# Patient Record
Sex: Male | Born: 1990 | Race: Black or African American | Hispanic: No | Marital: Single | Smoking: Former smoker
Health system: Southern US, Community
[De-identification: ages and names within clinical notes are randomized; demographics above are authoritative.]

## PROBLEM LIST (undated history)

## (undated) HISTORY — PX: HERNIA REPAIR: SHX51

---
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
## 2008-12-10 ENCOUNTER — Emergency Department (HOSPITAL_COMMUNITY): Admission: EM | Admit: 2008-12-10 | Discharge: 2008-12-10 | Payer: Self-pay | Admitting: Emergency Medicine

## 2011-08-04 LAB — CBC
Hemoglobin: 17.6 — ABNORMAL HIGH
MCHC: 33.3
MCV: 83
Platelets: 269
RDW: 13.3
WBC: 4.4

## 2011-08-04 LAB — I-STAT 8, (EC8 V) (CONVERTED LAB)
BUN: 7
Glucose, Bld: 92
Potassium: 3.7
TCO2: 28
pH, Ven: 7.372 — ABNORMAL HIGH

## 2011-08-04 LAB — COMPREHENSIVE METABOLIC PANEL
ALT: 17
Albumin: 4.4
Alkaline Phosphatase: 128 — ABNORMAL HIGH
Glucose, Bld: 81
Potassium: 3.8
Sodium: 136
Total Protein: 7.8

## 2011-08-04 LAB — URINALYSIS, ROUTINE W REFLEX MICROSCOPIC
Bilirubin Urine: NEGATIVE
Hgb urine dipstick: NEGATIVE
Hgb urine dipstick: NEGATIVE
Ketones, ur: NEGATIVE
Protein, ur: NEGATIVE
Protein, ur: NEGATIVE
Urobilinogen, UA: 0.2
Urobilinogen, UA: 0.2

## 2011-08-04 LAB — DIFFERENTIAL
Basophils Relative: 0
Basophils Relative: 0
Eosinophils Absolute: 0.1
Eosinophils Absolute: 0.6
Monocytes Absolute: 0.2
Monocytes Relative: 5
Monocytes Relative: 7
Neutrophils Relative %: 32 — ABNORMAL LOW
Neutrophils Relative %: 69

## 2011-08-04 LAB — POCT I-STAT CREATININE: Operator id: 270111

## 2011-09-05 ENCOUNTER — Emergency Department (HOSPITAL_COMMUNITY)
Admission: EM | Admit: 2011-09-05 | Discharge: 2011-09-06 | Disposition: A | Discharge status: Home / Self Care | Payer: Self-pay | Attending: Emergency Medicine | Admitting: Emergency Medicine

## 2011-09-05 ENCOUNTER — Emergency Department (HOSPITAL_COMMUNITY): Payer: Self-pay

## 2011-09-05 ENCOUNTER — Encounter: Payer: Self-pay | Admitting: *Deleted

## 2011-09-05 DIAGNOSIS — W219XXA Striking against or struck by unspecified sports equipment, initial encounter: Secondary | ICD-10-CM | POA: Insufficient documentation

## 2011-09-05 DIAGNOSIS — M79609 Pain in unspecified limb: Secondary | ICD-10-CM | POA: Insufficient documentation

## 2011-09-05 DIAGNOSIS — M79644 Pain in right finger(s): Secondary | ICD-10-CM

## 2011-09-05 DIAGNOSIS — Y9361 Activity, american tackle football: Secondary | ICD-10-CM | POA: Insufficient documentation

## 2011-09-05 NOTE — ED Notes (Addendum)
C/o R little finger injury, injured playing football Sunday, "thinks it might be dislocated", (wants L little finger checked out also d/t past injury and not healing right), c/o R little finger pain, no deformity noted, swelling possible. Skin intact.

## 2011-09-06 NOTE — ED Notes (Signed)
The pt jammed his rt little finger x 2 playing football yesterday.  C/o pain and swelling

## 2011-09-06 NOTE — ED Notes (Signed)
Right little finger and ring finger buddy taped

## 2011-09-06 NOTE — ED Provider Notes (Signed)
History     CSN: [id number] Arrival date & time: 09/05/2011 11:13 PM   First MD Initiated Contact with Patient 09/06/11 0303      Chief Complaint  Patient presents with  . Finger Injury    (Consider location/radiation/quality/duration/timing/severity/associated sxs/prior treatment) The history is provided by the patient.   patient reports injury to his right little finger yesterday while playing football.  He reports he jammed his.  He reports there was some deformity the symptoms resolved after he pulled on it.  He continues to have pain and difficulty bending his finger at the distal phalanx and DIP joint of his right little finger.  Nothing worsens the symptoms.  Nothing improves his symptoms.  He's had no other complaints.  His pain is moderate.  He has no numbness or tingling.  History reviewed. No pertinent past medical history.  Past Surgical History  Procedure Date  . Hernia repair     History reviewed. No pertinent family history.  History  Substance Use Topics  . Smoking status: Former Games developer  . Smokeless tobacco: Former Neurosurgeon    Quit date: 08/16/2011  . Alcohol Use: No      Review of Systems  All other systems reviewed and are negative.    Allergies  Shellfish-derived products  Home Medications  No current outpatient prescriptions on file.  BP 109/59  Pulse 69  Temp(Src) 98 F (36.7 C) (Oral)  Resp 15  SpO2 96%  Physical Exam  Constitutional: He is oriented to person, place, and time. He appears well-developed and well-nourished.  HENT:  Head: Normocephalic.  Eyes: EOM are normal.  Neck: Normal range of motion.  Pulmonary/Chest: Effort normal.  Musculoskeletal: Normal range of motion.       R. little finger with mild swelling of the DIP joint.  Patient is unable to flex at the DIP joint on exam.  He is able to extend without difficulty.  Is no pain with passive range of motion of his DIP joint of his right little finger.  Has normal perfusion  of his right little finger.  There are  no other abnormalities of the rest of his hand on the right  Neurological: He is alert and oriented to person, place, and time.  Psychiatric: He has a normal mood and affect.    ED Course  Procedures (including critical care time)  Labs Reviewed - No data to display Dg Finger Little Right  09/05/2011  *RADIOLOGY REPORT*  Clinical Data: Injury to right little finger while playing football; distal fifth interphalangeal joint pain.  RIGHT LITTLE FINGER 2+V  Comparison: None.  Findings: There is no evidence of fracture or dislocation.  There is dorsal subluxation at the fifth distal interphalangeal joint, with mild widening of the joint space.  This is thought to reflect underlying ligamentous injury.  An underlying joint effusion cannot be excluded.  No definite flexor or extensor tendon injury is characterized.  Remaining visualized joint spaces are preserved.  IMPRESSION:  1.  No evidence of fracture or dislocation. 2.  Dorsal subluxation at the fifth distal interphalangeal joint, with mild widening of the joint space.  This is thought to reflect underlying ligamentous injury; an underlying joint effusion cannot be excluded.  Original Report Authenticated By: Tonia Ghent, M.D.   I personally reviewed the patient's x-ray  1. Pain in finger of right hand       MDM  Suspect possible ligamentous injury.  No prior dislocation.  No lacerations.  No signs of infection.  We'll buddy tape the right little finger. Hand surgery follow up. Suspect [place] finger        Lyanne Co, MD 09/06/11 [phone]

## 2012-03-01 ENCOUNTER — Encounter (HOSPITAL_COMMUNITY): Payer: Self-pay

## 2012-03-01 ENCOUNTER — Emergency Department (HOSPITAL_COMMUNITY): Payer: Self-pay

## 2012-03-01 ENCOUNTER — Emergency Department (HOSPITAL_COMMUNITY)
Admission: EM | Admit: 2012-03-01 | Discharge: 2012-03-01 | Disposition: A | Discharge status: Home / Self Care | Payer: Self-pay | Attending: Emergency Medicine | Admitting: Emergency Medicine

## 2012-03-01 DIAGNOSIS — R0781 Pleurodynia: Secondary | ICD-10-CM

## 2012-03-01 DIAGNOSIS — R109 Unspecified abdominal pain: Secondary | ICD-10-CM | POA: Insufficient documentation

## 2012-03-01 DIAGNOSIS — R0789 Other chest pain: Secondary | ICD-10-CM | POA: Insufficient documentation

## 2012-03-01 MED ORDER — IBUPROFEN 600 MG PO TABS: 600.0000 mg | ORAL_TABLET | Freq: Four times a day (QID) | ORAL | Status: AC | PRN

## 2012-03-01 MED ORDER — OXYCODONE-ACETAMINOPHEN 5-325 MG PO TABS
1.0000 | ORAL_TABLET | Freq: Once | ORAL | Status: AC
  Administered 2012-03-01: 1 via ORAL
  Filled 2012-03-01: qty 1

## 2012-03-01 MED ORDER — OXYCODONE-ACETAMINOPHEN 5-325 MG PO TABS: 2.0000 | ORAL_TABLET | ORAL | Status: AC | PRN

## 2012-03-01 NOTE — Discharge Instructions (Signed)
Musculoskeletal Pain Musculoskeletal pain is muscle and boney aches and pains. These pains can occur in any part of the body. Your caregiver may treat you without knowing the cause of the pain. They may treat you if blood or urine tests, X-rays, and other tests were normal.  CAUSES There is often not a definite cause or reason for these pains. These pains may be caused by a type of germ (virus). The discomfort may also come from overuse. Overuse includes working out too hard when your body is not fit. Boney aches also come from weather changes. Bone is sensitive to atmospheric pressure changes. HOME CARE INSTRUCTIONS   Ask when your test results will be ready. Make sure you get your test results.   Only take over-the-counter or prescription medicines for pain, discomfort, or fever as directed by your caregiver. If you were given medications for your condition, do not drive, operate machinery or power tools, or sign legal documents for 24 hours. Do not drink alcohol. Do not take sleeping pills or other medications that may interfere with treatment.   Continue all activities unless the activities cause more pain. When the pain lessens, slowly resume normal activities. Gradually increase the intensity and duration of the activities or exercise.   During periods of severe pain, bed rest may be helpful. Lay or sit in any position that is comfortable.   Putting ice on the injured area.   Put ice in a bag.   Place a towel between your skin and the bag.   Leave the ice on for 15 to 20 minutes, 3 to 4 times a day.   Follow up with your caregiver for continued problems and no reason can be found for the pain. If the pain becomes worse or does not go away, it may be necessary to repeat tests or do additional testing. Your caregiver may need to look further for a possible cause.  SEEK IMMEDIATE MEDICAL CARE IF:  You have pain that is getting worse and is not relieved by medications.   You develop  chest pain that is associated with shortness or breath, sweating, feeling sick to your stomach (nauseous), or throw up (vomit).   Your pain becomes localized to the abdomen.   You develop any new symptoms that seem different or that concern you.  MAKE SURE YOU:   Understand these instructions.   Will watch your condition.   Will get help right away if you are not doing well or get worse.  Document Released: 10/10/2005 Document Revised: 09/29/2011 Document Reviewed: 05/30/2008 [facility] Patient Information 2012 [place], [place].  RESOURCE GUIDE  Dental Problems  Patients with Medicaid: [facility] [phone] [address]                                           [phone] [address] Phone:  [phone]                                                   Phone:  [phone]  If unable to pay or uninsured, contact:  [facility] or [facility]  Dept. to become qualified for the adult dental clinic.  Chronic Pain Problems Contact [facility]  [phone] Patients need to be referred by their primary care doctor.  Insufficient Money for Medicine Contact [employer]:  call "211" or [facility] [phone].  No Primary Care Doctor Call [facility]  [phone] Other agencies that provide inexpensive medical care    [facility]  [phone]    [facility]  [phone]    [facility]  [phone]    [facility]  [phone]    [facility]  [phone]    [facility]  [phone]  [facility]  [phone] [facility]  [phone] [facility]   [phone] (emergency services [phone])  Abuse/Neglect [facility] Child Abuse Hotline 413-593-7815 [facility] Child Abuse Hotline (980)165-5942 (After Hours)  Emergency Shelter [facility] [phone]  [facility] [phone] [facility] [phone]  MRSA Hotline #:   947-819-7161    [facility] Resources  [facility]  [employer]                           [facility] [address] [place]                     [address]         [address]  Blondell Reveal Phone:  [phone]                                  Phone:  [phone]                   Phone:  [phone]  [facility] Phone:  [phone]  [facility] Child Abuse Hotline [phone] [phone] (After Hours)

## 2012-03-01 NOTE — ED Notes (Signed)
Patient states onset left rib pain after playing football few days ago. States pain currently at rest 0/10 when move a certain way, cough or sneeze pain increases to 8/10 achy sharp shooting pain.  Airway intact bilateral equal chest rise and fall.  Lungs clear all fields.

## 2012-03-01 NOTE — ED Provider Notes (Signed)
History   This chart was scribed for Forbes Cellar, MD by Melba Coon. The patient was seen in room STRE6/STRE6 and the patient's care was started at 12:30PM.    CSN: [id number]  Arrival date & time 03/01/12  1150   First MD Initiated Contact with Patient 03/01/12 1201      Chief Complaint  Patient presents with  . Back Pain    (Consider location/radiation/quality/duration/timing/severity/associated sxs/prior treatment) HPI Wayne Thomas is a 21 y.o. male who presents to the Emergency Department complaining of constant moderate to severe back pain with an onset 5 days ago. Pt was playing a flag football game around onset; no trauma to back; States he turned "in a weird way" and heard a "pop", think he broke his left rib. Flexeril did not alleviate the pain. No incontinence or urinary problems. No HA, fever, neck pain, sore throat, rash, SOB/cp/palpitations, abd pain, n/v/d, dysuria, or extremity pain, edema, weakness, numbness, or tingling. No known allergies. No other pertinent medical symptoms. Denies h/o VTE in self or family. No recent hosp/surg/immob. No h/o cancer. Denies exogenous hormone use, no leg pain or swelling. Denies hematuria/dysuria/freq/urgency     ED Notes, ED Provider Notes from 03/01/12 0000 to 03/01/12 11:54:57       Joice Lofts, RN 03/01/2012 11:54      Pt complains of back pain and maybe a broken rib since football game on satruday     History reviewed. No pertinent past medical history.  Past Surgical History  Procedure Date  . Hernia repair     History reviewed. No pertinent family history.  History  Substance Use Topics  . Smoking status: Former Games developer  . Smokeless tobacco: Former Neurosurgeon    Quit date: 08/16/2011  . Alcohol Use: No     Review of Systems 10 Systems reviewed and all are negative for acute change except as noted in the HPI.   Allergies  Shellfish-derived products  Home Medications   Current Outpatient Rx  Name Route Sig  Dispense Refill  . IBUPROFEN 600 MG PO TABS Oral Take 1 tablet (600 mg total) by mouth every 6 (six) hours as needed for pain. 30 tablet 0  . OXYCODONE-ACETAMINOPHEN 5-325 MG PO TABS Oral Take 2 tablets by mouth every 4 (four) hours as needed for pain. 6 tablet 0    BP 124/74  Pulse 75  Temp(Src) 98.5 F (36.9 C) (Oral)  Resp 18  SpO2 100%  Physical Exam  Nursing note and vitals reviewed. Constitutional: He is oriented to person, place, and time. He appears well-developed and well-nourished. No distress.  HENT:  Head: Normocephalic and atraumatic.  Eyes: EOM are normal. Pupils are equal, round, and reactive to light.  Neck: Normal range of motion. Neck supple. No tracheal deviation present.       trachea midline  Cardiovascular: Normal rate.   Pulmonary/Chest: Effort normal. No respiratory distress.  Abdominal: Soft. There is no tenderness.       No CVAT  Musculoskeletal: Normal range of motion. He exhibits tenderness (Left axillary lower rib tenderness). He exhibits no edema.       Spine is midline. No paraspinal lumbar tenderness  Neurological: He is alert and oriented to person, place, and time.  Skin: Skin is warm and dry.  Psychiatric: He has a normal mood and affect. His behavior is normal.    ED Course  Procedures (including critical care time)  DIAGNOSTIC STUDIES: Oxygen Saturation is 100% on room air, normal by my interpretation.  COORDINATION OF CARE:  12:33PM - EDMD will order percocet and a left rib XR for the pt.   Labs Reviewed - No data to display Dg Ribs Unilateral W/chest Left  03/01/2012  *RADIOLOGY REPORT*  Clinical Data: Lower left rib pain.  LEFT RIBS AND CHEST - 3+ VIEW  Comparison: PA and lateral chest 07/16/2007.  Findings: Lungs are clear.  No pneumothorax or pleural fluid. Heart size normal.  No fracture.  IMPRESSION: Negative exam.  Original Report Authenticated By: Bernadene Bell. D'ALESSIO, M.D.    1. Rib pain on left side   2. Flank pain      MDM  Lt flank/rib pain with point tenderness after playing football. No recent illness. No direct trauma. Doubt intra-abdominal etiology but patient given strict precautions for return. No EMC precluding discharge at this time. Given Precautions for return. PMD f/u.  I personally performed the services described in this documentation, which was scribed in my presence. The recorded information has been reviewed and considered.         Forbes Cellar, MD 03/01/12 [phone]

## 2012-03-01 NOTE — ED Notes (Signed)
Pt complains of back pain and maybe a broken rib since football game on satruday

## 2012-10-26 IMAGING — CR DG RIBS W/ CHEST 3+V*L*
3 series · 3 of 3 positions shown · non-contrast
Comparison: PA and lateral chest 07/16/2007.

CLINICAL DATA: Lower left rib pain.

LEFT RIBS AND CHEST - 3+ VIEW

[w chest pa]
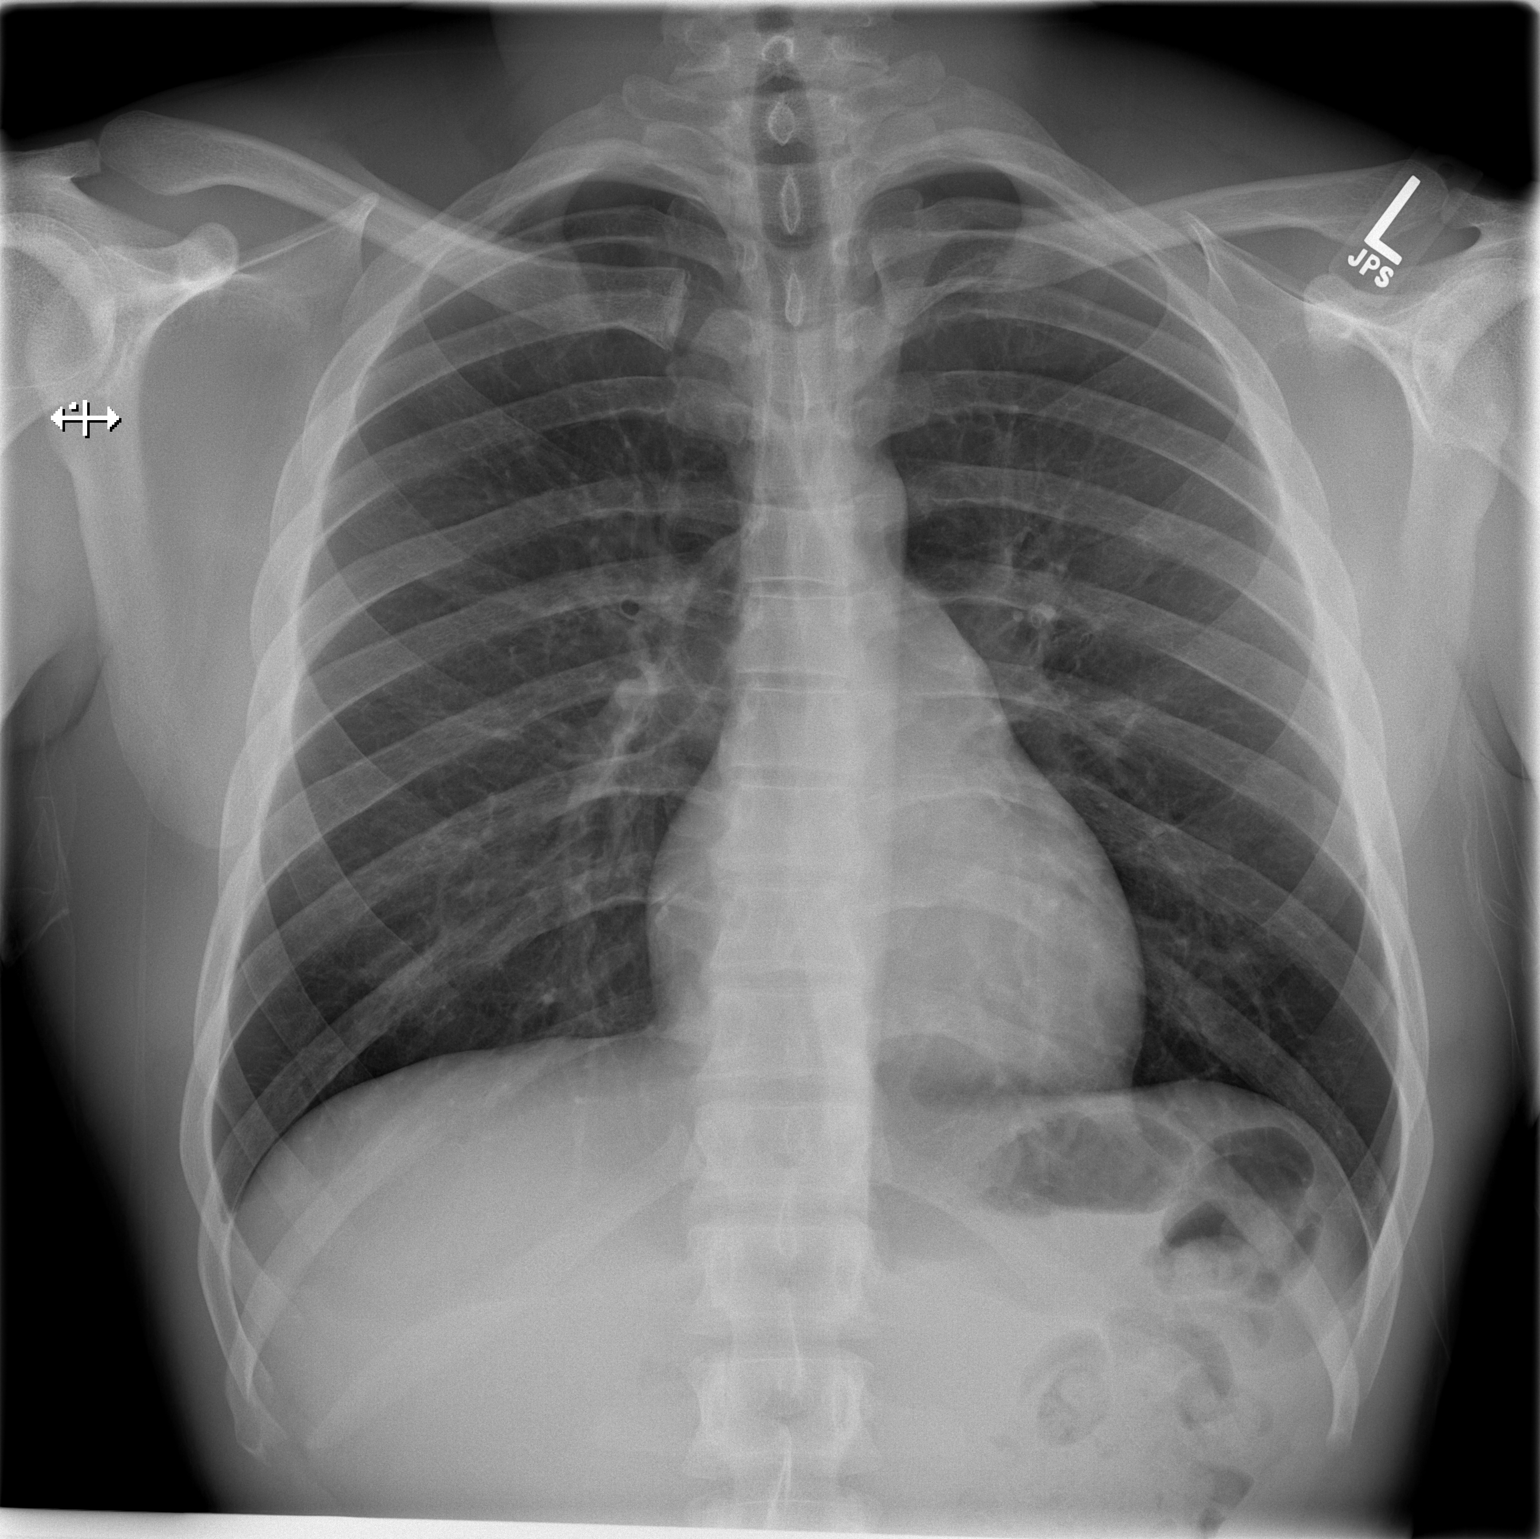

[w ribs ap upper left]
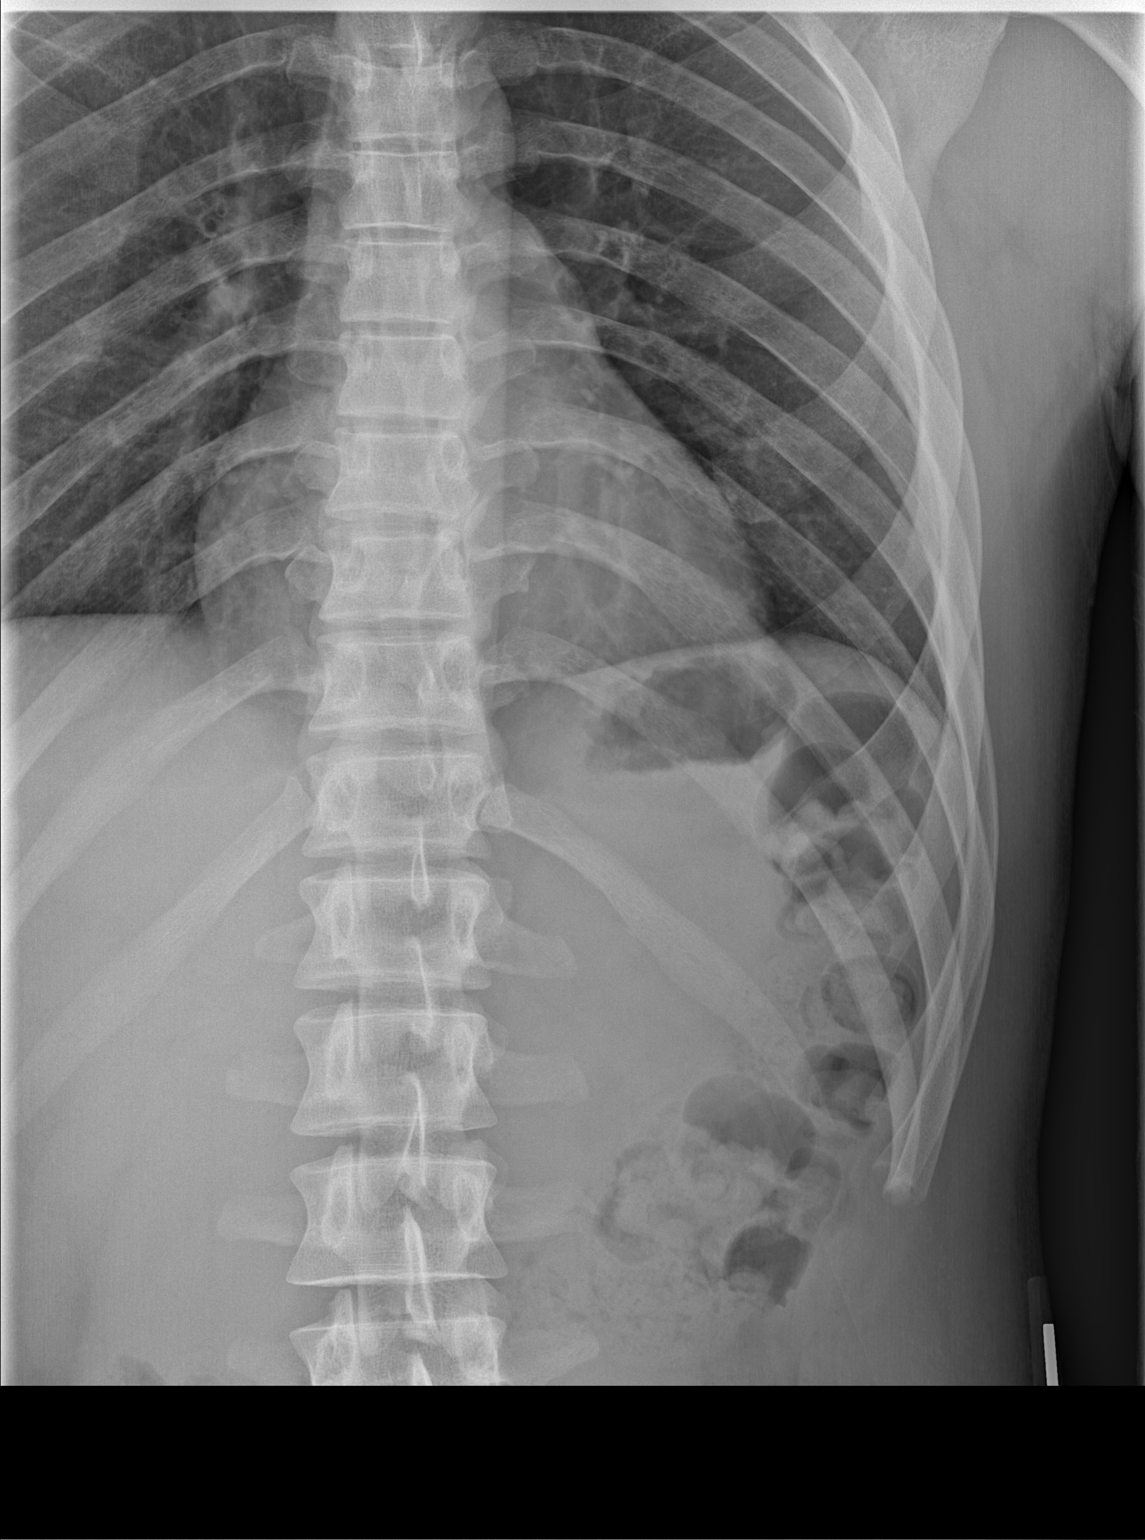

[w ribs ap lower left]
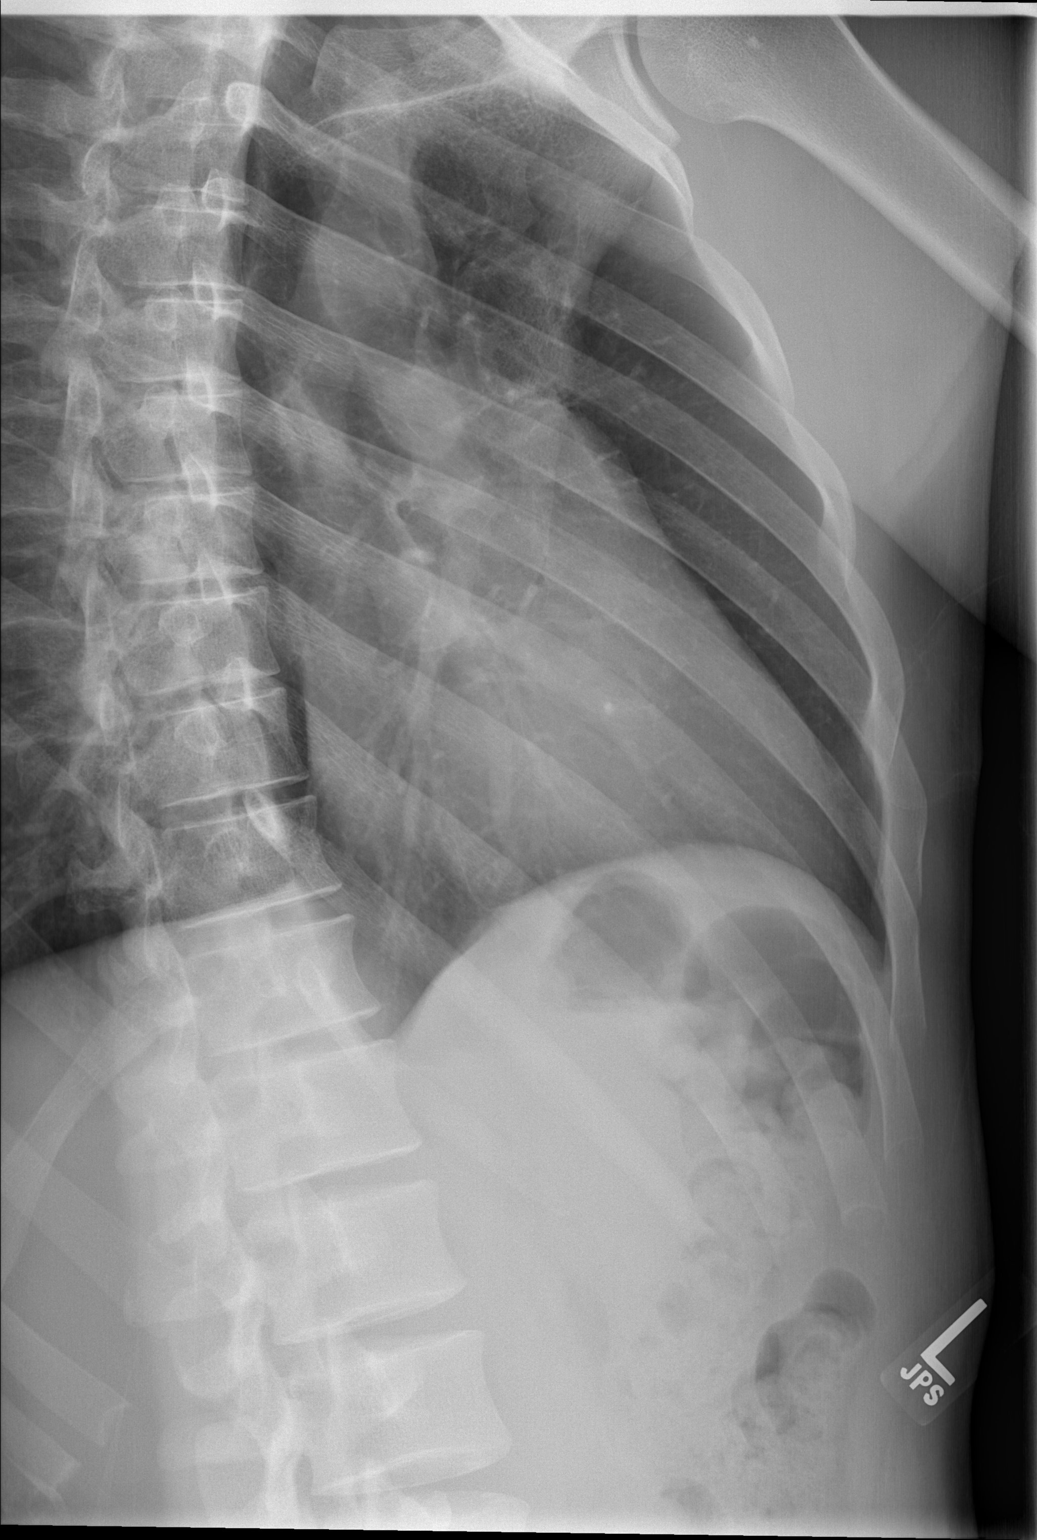

[3 of 3 positions shown; findings below may reference images not displayed]

FINDINGS: Lungs are clear.  No pneumothorax or pleural fluid.
Heart size normal.  No fracture.
IMPRESSION: Negative exam.

## 2021-03-20 ENCOUNTER — Encounter (HOSPITAL_COMMUNITY): Payer: Self-pay

## 2021-03-20 ENCOUNTER — Ambulatory Visit (HOSPITAL_COMMUNITY)
Admission: EM | Admit: 2021-03-20 | Discharge: 2021-03-20 | Disposition: A | Discharge status: Home / Self Care | Payer: Self-pay | Attending: Physician Assistant | Admitting: Physician Assistant

## 2021-03-20 DIAGNOSIS — L853 Xerosis cutis: Secondary | ICD-10-CM | POA: Insufficient documentation

## 2021-03-20 DIAGNOSIS — J069 Acute upper respiratory infection, unspecified: Secondary | ICD-10-CM

## 2021-03-20 DIAGNOSIS — Z87891 Personal history of nicotine dependence: Secondary | ICD-10-CM | POA: Insufficient documentation

## 2021-03-20 DIAGNOSIS — U071 COVID-19: Secondary | ICD-10-CM | POA: Insufficient documentation

## 2021-03-20 LAB — SARS CORONAVIRUS 2 (TAT 6-24 HRS): SARS Coronavirus 2: POSITIVE — AB

## 2021-03-20 NOTE — ED Provider Notes (Signed)
[facility]    CSN: [id number] Arrival date & time: 03/20/21  1204      History   Chief Complaint Chief Complaint  Patient presents with  . Chills  . Generalized Body Aches  . Fatigue    HPI Wayne Thomas is a 30 y.o. male.   Patient here c/w fatigue x 4 days, improving. A dmits f/c (did not check temp), myalgia, nausea.  Denies nasal congestion, rhinorrhea, sore throat, cough, wheezing.  He had loss of appetite, but that has returned.  He had negative COVID test 3 days ago.  Also concerned about dry skin on L arm.  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     History reviewed. No pertinent past medical history.  There are no problems to display for this patient.   Past Surgical History:  Procedure Laterality Date  . HERNIA REPAIR         Home Medications    Prior to Admission medications   Not on File    Family History History reviewed. No pertinent family history.  Social History Social History   Tobacco Use  . Smoking status: Former Games developer  . Smokeless tobacco: Former Neurosurgeon    Quit date: 08/16/2011  Substance Use Topics  . Alcohol use: No  . Drug use: No     Allergies   Shellfish-derived products   Review of Systems Review of Systems  Constitutional: Positive for chills, fatigue and fever.  HENT: Negative for congestion, ear pain, nosebleeds, postnasal drip, rhinorrhea, sinus pressure, sinus pain and sore throat.   Eyes: Negative for pain and redness.  Respiratory: Negative for cough, shortness of breath and wheezing.   Gastrointestinal: Positive for nausea. Negative for abdominal pain, diarrhea and vomiting.  Musculoskeletal: Positive for myalgias. Negative for arthralgias.  Skin: Negative for rash.  Neurological: Negative for light-headedness and headaches.  Hematological: Negative for adenopathy. Does not bruise/bleed easily.  Psychiatric/Behavioral:  Negative for confusion and sleep disturbance.     Physical Exam Triage Vital Signs ED Triage Vitals  Enc Vitals Group     BP 03/20/21 1245 114/73     Pulse Rate 03/20/21 1245 79     Resp 03/20/21 1245 18     Temp 03/20/21 1245 98.3 F (36.8 C)     Temp Source 03/20/21 1245 Oral     SpO2 03/20/21 1245 95 %     Weight --      Height --      Head Circumference --      Peak Flow --      Pain Score 03/20/21 1244 5     Pain Loc --      Pain Edu? --      Excl. in GC? --    No data found.  Updated Vital Signs BP 114/73   Pulse 79   Temp 98.3 F (36.8 C) (Oral)   Resp 18   SpO2 95%   Visual Acuity Right Eye Distance:   Left Eye Distance:   Bilateral Distance:    Right Eye Near:   Left Eye Near:    Bilateral Near:     Physical Exam Vitals and nursing note reviewed.  Constitutional:      General: He is not in acute distress.    Appearance: Normal appearance. He is not ill-appearing.  HENT:     Head: Normocephalic and atraumatic.     Right Ear:  Tympanic membrane and ear canal normal.     Left Ear: Tympanic membrane and ear canal normal.     Nose: No congestion or rhinorrhea.     Mouth/Throat:     Pharynx: No oropharyngeal exudate or posterior oropharyngeal erythema.  Eyes:     General: No scleral icterus.    Extraocular Movements: Extraocular movements intact.     Conjunctiva/sclera: Conjunctivae normal.  Cardiovascular:     Rate and Rhythm: Normal rate and regular rhythm.     Heart sounds: No murmur heard.   Pulmonary:     Effort: Pulmonary effort is normal. No respiratory distress.     Breath sounds: Normal breath sounds. No wheezing or rales.  Musculoskeletal:     Cervical back: Normal range of motion. No rigidity.  Skin:    Capillary Refill: Capillary refill takes less than 2 seconds.     Coloration: Skin is not jaundiced.     Findings: No rash.     Comments: Left arm below t-shirt level to wrist hyperpigmented with dry, flaky skin  Neurological:      General: No focal deficit present.     Mental Status: He is alert and oriented to person, place, and time.     Motor: No weakness.     Gait: Gait normal.  Psychiatric:        Mood and Affect: Mood normal.        Behavior: Behavior normal.      UC Treatments / Results  Labs (all labs ordered are listed, but only abnormal results are displayed) Labs Reviewed  SARS CORONAVIRUS 2 (TAT 6-24 HRS)    EKG   Radiology No results found.  Procedures Procedures (including critical care time)  Medications Ordered in UC Medications - No data to display  Initial Impression / Assessment and Plan / UC Course  I have reviewed the triage vital signs and the nursing notes.  Pertinent labs & imaging results that were available during my care of the patient were reviewed by me and considered in my medical decision making (see chart for details).     Sx improving, likely viral infection causing fatigue Will send out COVID PCR test to confirm negative home COVID test Get plenty of rest Drink plenty of water  Protect arm from UV light of welder Recommend moisturize on arm Final Clinical Impressions(s) / UC Diagnoses   Final diagnoses:  Viral upper respiratory tract infection  Dry skin     Discharge Instructions     Drink plenty of water Get plenty of rest  Labs will be on mychart in 1 - 2 days.  Use moisturizer on arm Protect arm when welding    ED Prescriptions    None     PDMP not reviewed this encounter.   Evern Core, PA-C 03/20/21 1351

## 2021-03-20 NOTE — Discharge Instructions (Addendum)
Drink plenty of water Get plenty of rest  Labs will be on mychart in 1 - 2 days.  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

## 2021-03-20 NOTE — ED Triage Notes (Signed)
Pt in with c/o chills, fatigue, body aches and loss of appetite since Wednesday  Pt had at home covid test with negative results

## 2021-03-21 ENCOUNTER — Telehealth: Payer: Self-pay | Admitting: Physician Assistant

## 2021-03-21 NOTE — Telephone Encounter (Signed)
Called to discuss with Wayne Thomas about Covid symptoms and the use of bebtelivomab, remdisivir or oral therapies for those with mild to moderate Covid symptoms and at a high risk of hospitalization.     Pt does not qualify as pt's symptoms first presented > 5 days so out of window for oral therapy and would be >7 days from symptom onset by the time we have infusion availability. Symptoms tier reviewed as well as criteria for ending isolation. Preventative practices reviewed. Patient verbalized understanding  There are no problems to display for this patient.   Cline Crock PA-C
